# Patient Record
Sex: Male | Born: 1985 | Race: White | Hispanic: No | Marital: Single
Health system: Southern US, Community
[De-identification: ages and names within clinical notes are randomized; demographics above are authoritative.]

---

## 2009-08-04 ENCOUNTER — Emergency Department: Payer: Self-pay | Admitting: Emergency Medicine

## 2011-03-25 ENCOUNTER — Emergency Department: Payer: Self-pay | Admitting: Emergency Medicine

## 2011-05-18 IMAGING — US US PELVIS LIMITED
1 series · 17 of 25 positions shown · non-contrast
Comparison: none

REASON FOR EXAM: right testicular pain and swelling
COMMENTS:

[Series 1: us pelvis limited · 17 of 47 slices shown]
[im 1/47]
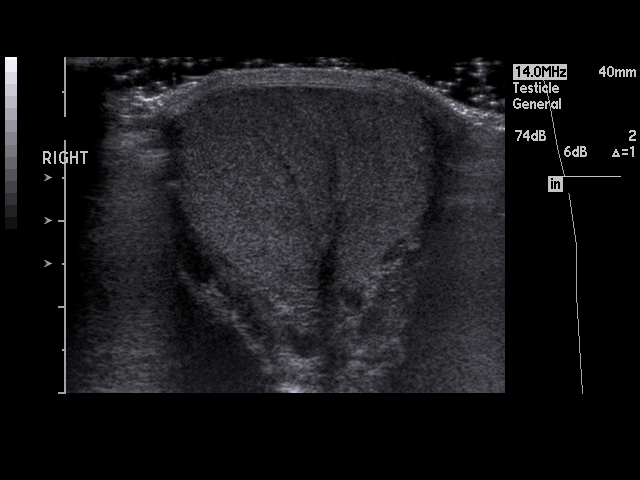
[im 4/47]
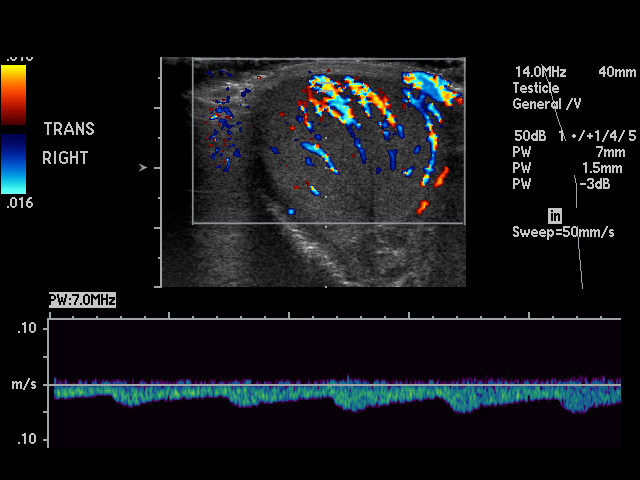
[im 6/47]
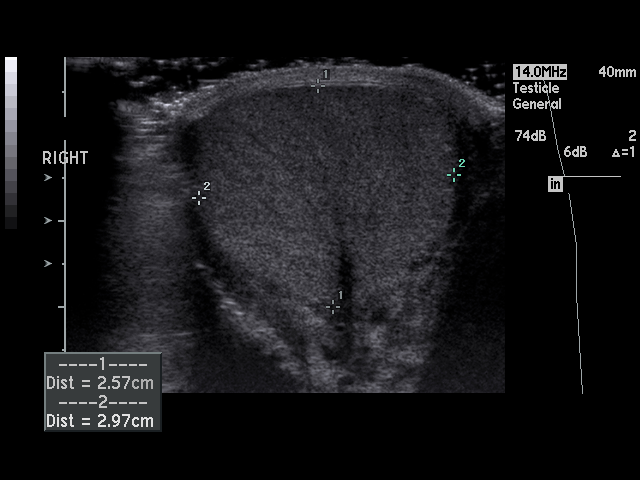
[im 10/47]
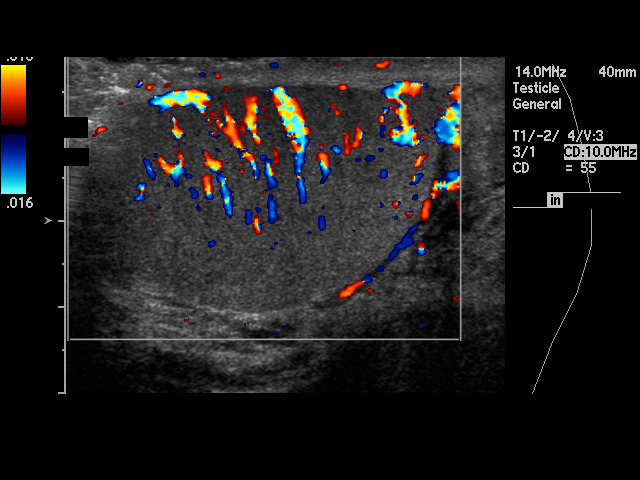
[im 12/47]
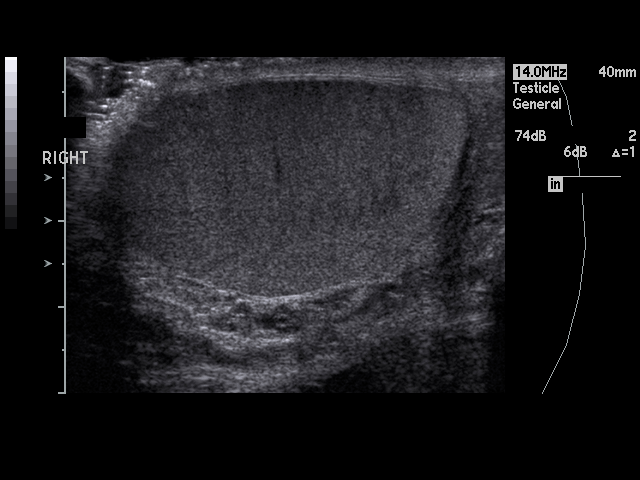
[im 16/47]
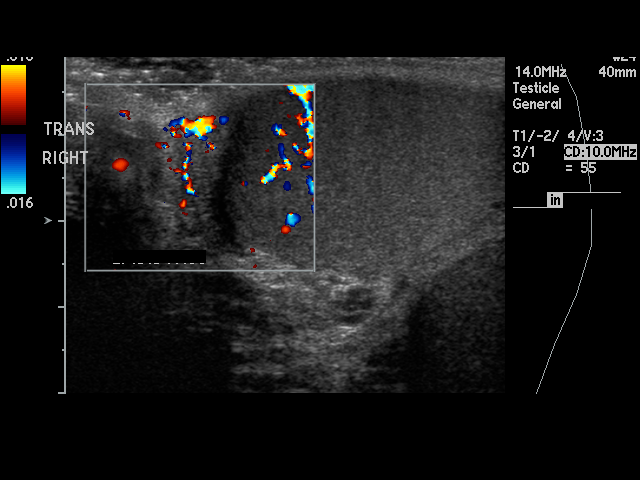
[im 18/47]
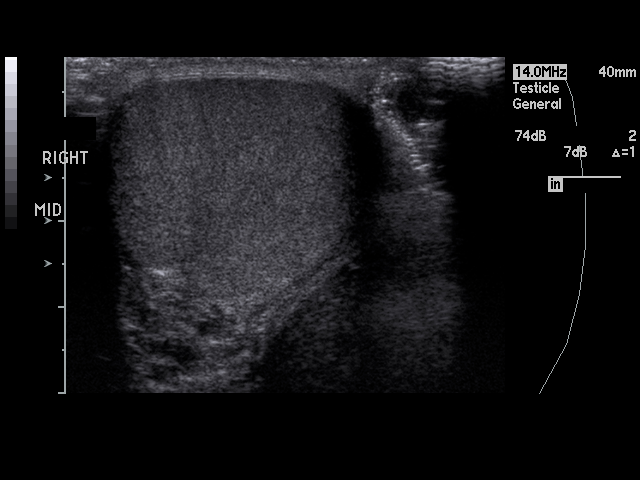
[im 22/47]
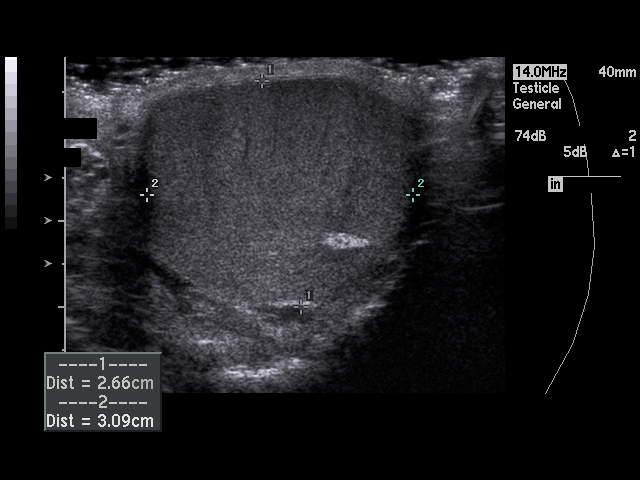
[im 24/47]
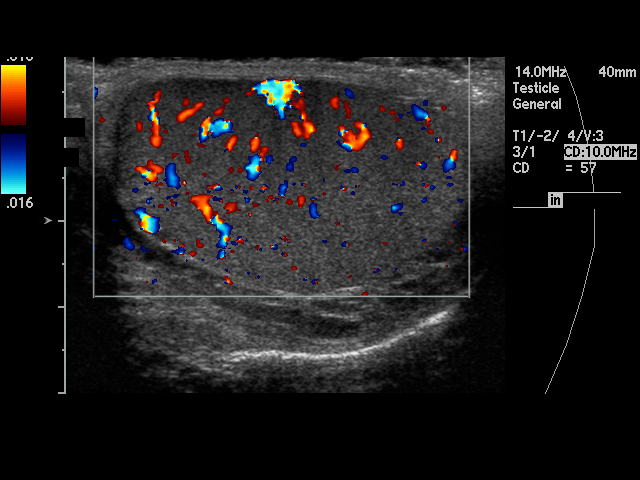
[im 25/47]
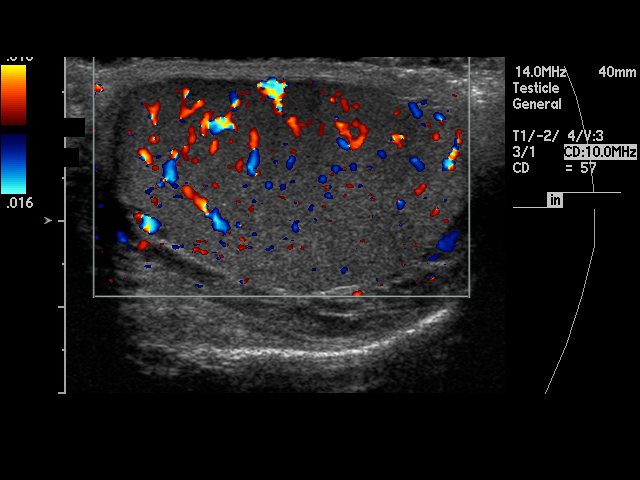
[im 29/47]
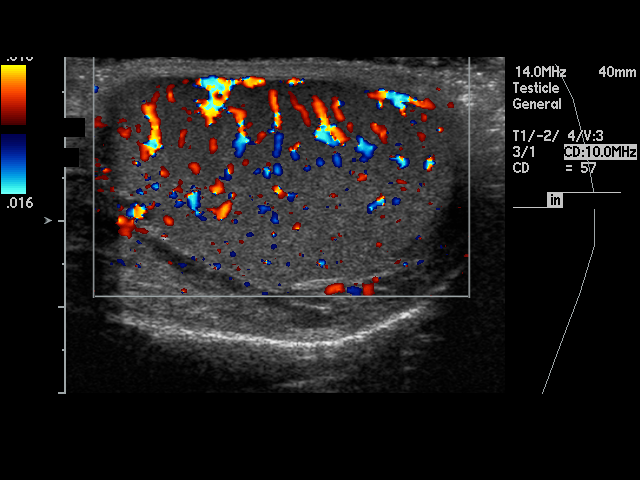
[im 31/47]
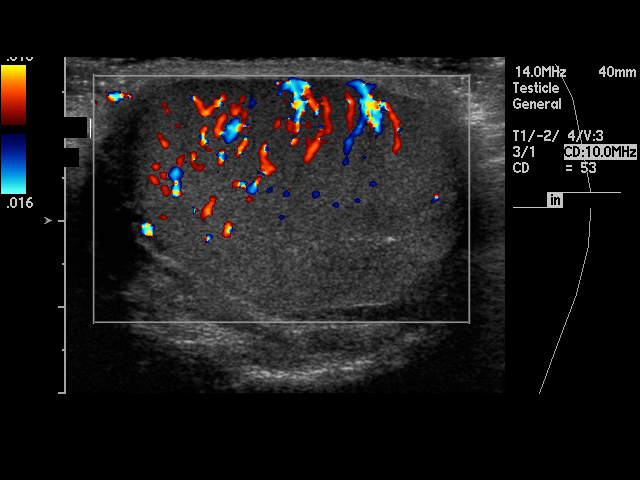
[im 35/47]
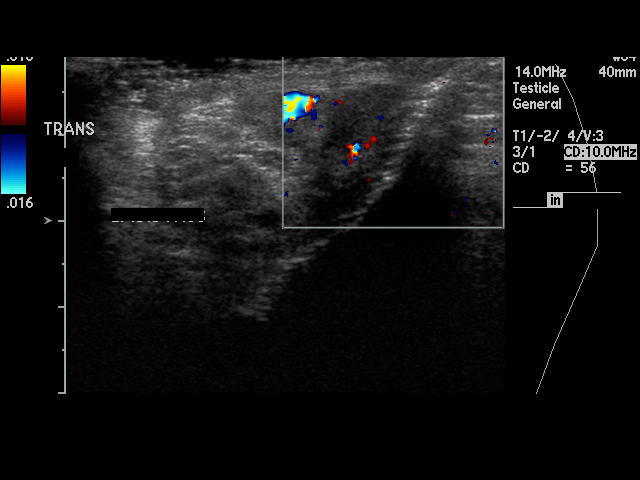
[im 37/47]
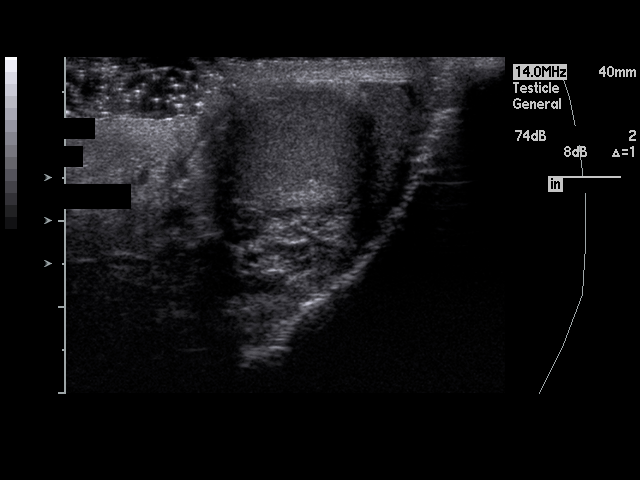
[im 41/47]
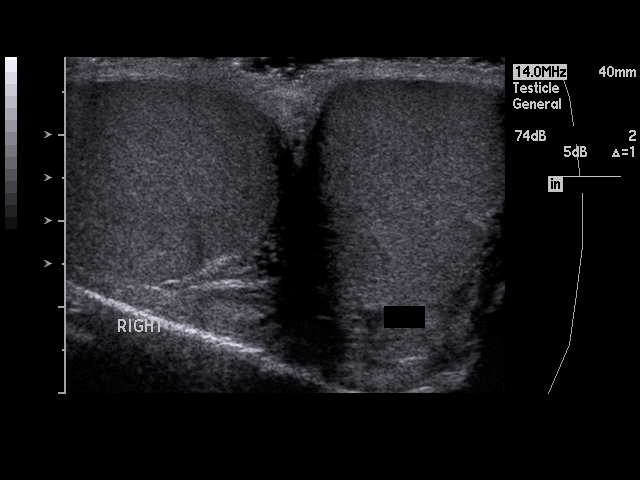
[im 43/47]
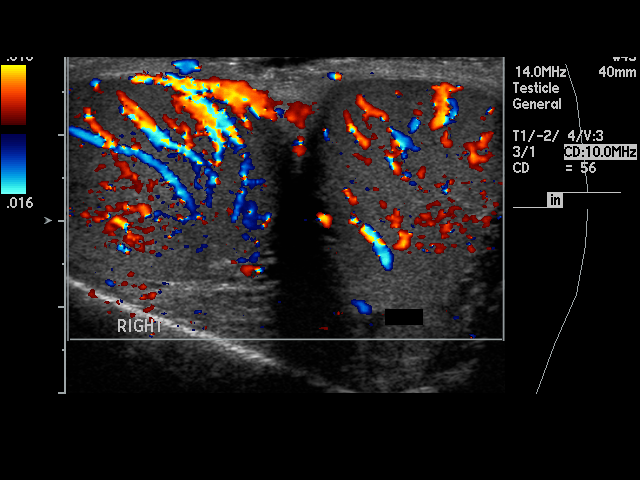
[im 47/47]
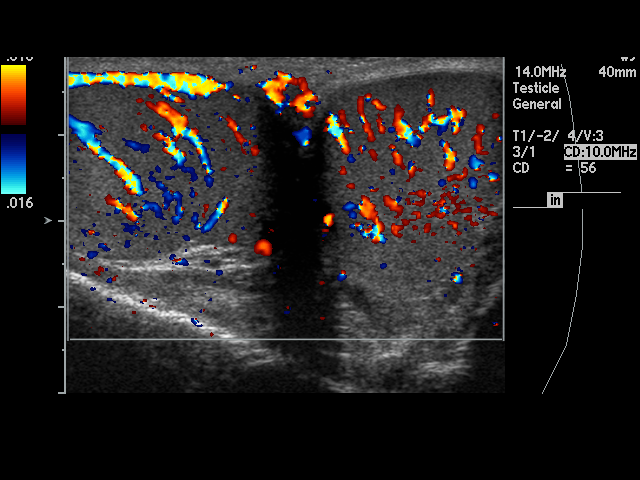

[17 of 25 positions shown; findings below may reference images not displayed]

PROCEDURE:     US  - US TESTICULAR  - August 04, 2009 [DATE]

RESULT:     The echotexture of the testes is normal bilaterally. The right
testicle measures 4 x 2.6 x 3 cm. Left testicle measures 4.2 x 2.7 x 3.1 cm.
Vascularity of both testes is normal. The epididymal structures are normal
in echotexture and size and exhibit normal vascularity. I see no hydrocele.
IMPRESSION: I do not see evidence of testicular torsion or
inflammation. No testicular or epididymal masses are demonstrated.

A preliminary report was sent to the [HOSPITAL] the conclusion
of the study.
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
## 2014-06-19 ENCOUNTER — Emergency Department: Payer: Self-pay | Admitting: Emergency Medicine
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
## 2021-09-09 ENCOUNTER — Emergency Department (HOSPITAL_COMMUNITY)
Admission: EM | Admit: 2021-09-09 | Discharge: 2021-09-09 | Disposition: A | Discharge status: Home / Self Care | Payer: Self-pay | Attending: Emergency Medicine | Admitting: Emergency Medicine

## 2021-09-09 ENCOUNTER — Emergency Department (HOSPITAL_COMMUNITY): Payer: Self-pay

## 2021-09-09 DIAGNOSIS — S61011A Laceration without foreign body of right thumb without damage to nail, initial encounter: Secondary | ICD-10-CM | POA: Diagnosis not present

## 2021-09-09 DIAGNOSIS — M7989 Other specified soft tissue disorders: Secondary | ICD-10-CM | POA: Diagnosis not present

## 2021-09-09 DIAGNOSIS — W228XXA Striking against or struck by other objects, initial encounter: Secondary | ICD-10-CM | POA: Diagnosis not present

## 2021-09-09 DIAGNOSIS — S50812A Abrasion of left forearm, initial encounter: Secondary | ICD-10-CM | POA: Insufficient documentation

## 2021-09-09 DIAGNOSIS — S61210A Laceration without foreign body of right index finger without damage to nail, initial encounter: Secondary | ICD-10-CM | POA: Diagnosis not present

## 2021-09-09 DIAGNOSIS — S61212A Laceration without foreign body of right middle finger without damage to nail, initial encounter: Secondary | ICD-10-CM | POA: Diagnosis not present

## 2021-09-09 DIAGNOSIS — S51811A Laceration without foreign body of right forearm, initial encounter: Secondary | ICD-10-CM | POA: Insufficient documentation

## 2021-09-09 DIAGNOSIS — S41111A Laceration without foreign body of right upper arm, initial encounter: Secondary | ICD-10-CM

## 2021-09-09 DIAGNOSIS — S59911A Unspecified injury of right forearm, initial encounter: Secondary | ICD-10-CM | POA: Diagnosis present

## 2021-09-09 LAB — I-STAT CHEM 8, ED
BUN: 16 mg/dL (ref 6–20)
Calcium, Ion: 1.15 mmol/L (ref 1.15–1.40)
Chloride: 102 mmol/L (ref 98–111)
Creatinine, Ser: 1.1 mg/dL (ref 0.61–1.24)
Glucose, Bld: 117 mg/dL — ABNORMAL HIGH (ref 70–99)
HCT: 38 % — ABNORMAL LOW (ref 39.0–52.0)
Hemoglobin: 12.9 g/dL — ABNORMAL LOW (ref 13.0–17.0)
Potassium: 3.7 mmol/L (ref 3.5–5.1)
Sodium: 139 mmol/L (ref 135–145)
TCO2: 27 mmol/L (ref 22–32)

## 2021-09-09 LAB — BASIC METABOLIC PANEL
Anion gap: 7 (ref 5–15)
BUN: 14 mg/dL (ref 6–20)
CO2: 28 mmol/L (ref 22–32)
Calcium: 9.3 mg/dL (ref 8.9–10.3)
Chloride: 105 mmol/L (ref 98–111)
Creatinine, Ser: 1.09 mg/dL (ref 0.61–1.24)
GFR, Estimated: >60 mL/min (ref >60)
Glucose, Bld: 124 mg/dL — ABNORMAL HIGH (ref 70–99)
Potassium: 3.8 mmol/L (ref 3.5–5.1)
Sodium: 140 mmol/L (ref 135–145)

## 2021-09-09 LAB — CBC
HCT: 39.1 % (ref 39.0–52.0)
Hemoglobin: 13.1 g/dL (ref 13.0–17.0)
MCH: 28.5 pg (ref 26.0–34.0)
MCHC: 33.5 g/dL (ref 30.0–36.0)
MCV: 85 fL (ref 80.0–100.0)
Platelets: 336 10*3/uL (ref 150–400)
RBC: 4.6 MIL/uL (ref 4.22–5.81)
RDW: 11.9 % (ref 11.5–15.5)
WBC: 15.1 10*3/uL — ABNORMAL HIGH (ref 4.0–10.5)
nRBC: 0 % (ref 0.0–0.2)

## 2021-09-09 MED ORDER — HYDROMORPHONE HCL 1 MG/ML IJ SOLN
INTRAMUSCULAR | Status: AC
  Administered 2021-09-09: 1 mg via INTRAVENOUS
  Filled 2021-09-09: qty 1

## 2021-09-09 MED ORDER — HYDROMORPHONE HCL 1 MG/ML IJ SOLN: 1.0000 mg | Freq: Once | INTRAMUSCULAR | Status: AC

## 2021-09-09 NOTE — Progress Notes (Signed)
   09/09/21 0205  Clinical Encounter Type  Visited With Patient  Visit Type Initial;Trauma  Referral From Nurse  Consult/Referral To Chaplain   Chaplain Jorene Guest responded to page. The patient is being treated by the medical team.  There is no support person present. Chaplain remains available as needed. This note was prepared by Jeanine Luz, M.Div..  For questions please contact by phone [phone].

## 2021-09-09 NOTE — ED Notes (Signed)
Kenneth Nolan, wife, [phone] can be called if need any info on pt

## 2021-09-09 NOTE — Progress Notes (Signed)
Orthopedic Tech Progress Note Patient Details:  Kenneth Nolan 1985/09/03 [id number]  Ortho Devices Type of Ortho Device: Volar splint Ortho Device/Splint Location: RUE Ortho Device/Splint Interventions: Ordered, Application, Adjustment   Post Interventions Patient Tolerated: Well Instructions Provided: Care of device High volar applied per request of MD to prevent knuckles from bending.  Vernona Rieger 09/09/2021, 3:44 AM

## 2021-09-09 NOTE — Discharge Instructions (Addendum)
Please keep the splint on for at least 3 days, preferably 5. Then return to get sutures out in about 10 days.
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
## 2021-09-09 NOTE — Progress Notes (Signed)
Orthopedic Tech Progress Note Patient Details:  Kenneth Nolan 1986/03/04 [id number]  Patient ID: Doristine Church, male   DOB: 23-Oct-1985, 36 y.o.   MRN: [id number] Level II; not needed at the moment.  Darleen Crocker 09/09/2021, 2:31 AM

## 2021-09-14 ENCOUNTER — Ambulatory Visit: Payer: Self-pay | Admitting: *Deleted

## 2021-09-14 NOTE — Telephone Encounter (Signed)
  Chief Complaint: requesting where he can go to get stitches removed Symptoms: stitches right wrist and knuckles. To be removed in 10 days  Frequency: na Pertinent Negatives: Patient denies na Disposition: [x] ED /[] Urgent Care (no appt availability in office) / [] Appointment(In office/virtual)/ []  [facility]/ [] Home Care/ [] Refused Recommended Disposition /[] [facility]/ []  Follow-up with PCP Additional Notes:   Patient does not have a PCP and reports he can not afford one. Instructed patient he will need to come back to ED and report he is there to have stitches removed.     Reason for Disposition  [1] Caller requesting NON-URGENT health information AND [2] PCP's office is the best resource  Answer Assessment - Initial Assessment Questions 1. REASON FOR CALL or QUESTION: "What is your reason for calling today?" or "How can I best help you?" or "What question do you have that I can help answer?"     Patient would like to know where he can go to get stitches out of wrist and knuckles.  Protocols used: Information Only Call - No Triage-A-AH
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
## 2023-10-29 ENCOUNTER — Other Ambulatory Visit: Payer: Self-pay

## 2023-10-29 ENCOUNTER — Encounter: Payer: Self-pay | Admitting: *Deleted

## 2023-10-29 ENCOUNTER — Emergency Department
Admission: EM | Admit: 2023-10-29 | Discharge: 2023-10-29 | Disposition: A | Discharge status: Home / Self Care | Payer: Self-pay

## 2023-10-29 DIAGNOSIS — K0889 Other specified disorders of teeth and supporting structures: Secondary | ICD-10-CM

## 2023-10-29 DIAGNOSIS — K047 Periapical abscess without sinus: Secondary | ICD-10-CM | POA: Insufficient documentation

## 2023-10-29 MED ORDER — AMOXICILLIN-POT CLAVULANATE 875-125 MG PO TABS
1.0000 | ORAL_TABLET | Freq: Once | ORAL | Status: AC
  Administered 2023-10-29: 1 via ORAL
  Filled 2023-10-29: qty 1

## 2023-10-29 MED ORDER — AMOXICILLIN-POT CLAVULANATE 875-125 MG PO TABS: 1.0000 | ORAL_TABLET | Freq: Two times a day (BID) | ORAL | 0 refills | Status: AC

## 2023-10-29 NOTE — ED Triage Notes (Signed)
 Pt ambulatory to triage.  Pt has pain in left upper gum.  Pt has a sore on upper gum.  Pt had a filling break off upper tooth recently. Pt alert.

## 2023-10-29 NOTE — Discharge Instructions (Addendum)
 You have been diagnosed with dental abscess, please take Augmentin  1 tablet by mouth every 12 hours after main meals for 7 days.  Please call to the dentist and make an appointment for a follow-up.  You can call [facility] to establish care.  Please use GoodRx card to buy your prescription.  Please come back to ED or go to your PCP if you have new symptoms or symptoms worsen. OPTIONS FOR DENTAL FOLLOW UP CARE  [place] [facility] - Local Safety Net Dental Clinics [email or web address]   [facility] [phone])  Norita Goldberg [phone])  [facility] [phone] ext [phone])  [facility] [phone])  [facility] [phone]) This clinic caters to the indigent population and is on a lottery system. Location: [employer] [facility], [employer], [address] [place] Clinic Hours: Wednesdays from 6pm - 9pm, patients seen by a lottery system. For dates, call or go to [email or web address] Services: Cleanings, fillings and simple extractions. Payment Options: DENTAL WORK IS FREE OF CHARGE. Bring proof of income or support. Best way to get seen: Arrive at 5:15 pm - this is a lottery, NOT first come/first serve, so arriving earlier will not increase your chances of being seen.     [facility] [phone] Select option 1 for emergencies   Location: [facility], [place], [address] [place] Clinic Hours: No walk-ins accepted - call the day before to schedule an appointment. Check in times are 9:30 am and 1:30 pm. Services: Simple extractions, temporary fillings, pulpectomy/pulp debridement, uncomplicated abscess drainage. Payment Options: PAYMENT IS DUE AT THE TIME OF SERVICE.  Fee is usually $100-200, additional surgical procedures (e.g. abscess drainage) may be  extra. Cash, checks, Visa/MasterCard accepted.  Can file Medicaid if patient is covered for dental - patient should call case worker to check. No discount for [facility] patients. Best way to get seen: MUST call the day before and get onto the schedule. Can usually be seen the next 1-2 days. No walk-ins accepted.     [facility] [phone]   Location: [facility], [address], [place] Clinic Hours: M, W, Th, F 8am or 1:30pm, Tues 9a or 1:30 - first come/first served. Services: Simple extractions, temporary fillings, uncomplicated abscess drainage.  You do not need to be an [facility] resident. Payment Options: PAYMENT IS DUE AT THE TIME OF SERVICE. Dental insurance, otherwise sliding scale - bring proof of income or support. Depending on income and treatment needed, cost is usually $50-200. Best way to get seen: Arrive early as it is first come/first served.     [facility] [phone]   Location: [address] Clinic Hours: Mon-Thu 8a-5p Services: Most basic dental services including extractions and fillings. Payment Options: PAYMENT IS DUE AT THE TIME OF SERVICE. Sliding scale, up to 50% off - bring proof if income or support. Medicaid with dental option accepted. Best way to get seen: Call to schedule an appointment, can usually be seen within 2 weeks OR they will try to see walk-ins - show up at 8a or 2p (you may have to wait).     [facility] [phone] [place] RESIDENTS ONLY   Location: [facility], [address], [place], [place] [address] Clinic Hours: By appointment only. Monday - Thursday 8am-5pm, Friday 8am-12pm Services: Cleanings, fillings, extractions. Payment Options: PAYMENT IS DUE  AT THE TIME OF SERVICE. Cash, Visa or MasterCard. Sliding scale - $30 minimum per service. Best way to get seen: Come in to office,  complete packet and make an appointment - need proof of income or support monies for each household member and proof of [facility] residence. Usually takes about a month to get in.     [facility] [phone]   Location: [address] [facility] Clinic Hours: Walk-in Urgent Care Dental Services are offered Monday-Friday mornings only. The numbers of emergencies accepted daily is limited to the number of providers available. Maximum 15 - Mondays, Wednesdays & Thursdays Maximum 10 - Tuesdays & Fridays Services: You do not need to be a [facility] resident to be seen for a dental emergency. Emergencies are defined as pain, swelling, abnormal bleeding, or dental trauma. Walkins will receive x-rays if needed. NOTE: Dental cleaning is not an emergency. Payment Options: PAYMENT IS DUE AT THE TIME OF SERVICE. Minimum co-pay is $40.00 for uninsured patients. Minimum co-pay is $3.00 for Medicaid with dental coverage. Dental Insurance is accepted and must be presented at time of visit. Medicare does not cover dental. Forms of payment: Cash, credit card, checks. Best way to get seen: If not previously registered with the clinic, walk-in dental registration begins at 7:15 am and is on a first come/first serve basis. If previously registered with the clinic, call to make an appointment.     [facility] [phone] [place] RESIDENTS ONLY   Location: [address] [place], [place] Clinic Hours: Mon-Thu 10a-2p Services: Extractions only! Payment Options: FREE (donations accepted) - bring proof of income or support Best way to get seen: Call and schedule an appointment OR come at 8am on the 1st Monday of every month (except for holidays) when it is first come/first served.     [facility] [phone]   Location: [address] [place], [place] Clinic Hours: Friday mornings Services, Payment Options, Best way to get seen: Call for  info

## 2023-10-29 NOTE — ED Provider Notes (Cosign Needed Addendum)
 [facility] Provider Note    Event Date/Time   First MD Initiated Contact with Patient 10/29/23 2233     (approximate)   History   Dental Pain    HPI  Kenneth Nolan is a 38 y.o. male    with a past medical history of laceration upper extremity,  who presents to the ED complaining of dental pain. According to the patient, symptoms began 4 weeks ago with presence of induration in the left upper gum.  Patient states a filling  came out 2 months ago.  Patient is not working, he does not have any insurance.  Patient denies having drainage from the area, patient denies pain, fever, chills, left ear pain.     There are no active problems to display for this patient.    ROS: Patient currently denies any vision changes, tinnitus, difficulty speaking, facial droop, sore throat, chest pain, shortness of breath, abdominal pain, nausea/vomiting/diarrhea, dysuria, or weakness/numbness/paresthesias in any extremity   Physical Exam   Triage Vital Signs: ED Triage Vitals  Encounter Vitals Group     BP 10/29/23 2213 114/79     Girls Systolic BP Percentile --      Girls Diastolic BP Percentile --      Boys Systolic BP Percentile --      Boys Diastolic BP Percentile --      Pulse Rate 10/29/23 2213 67     Resp 10/29/23 2213 18     Temp 10/29/23 2213 97.8 F (36.6 C)     Temp Source 10/29/23 2213 Oral     SpO2 10/29/23 2213 99 %     Weight 10/29/23 2211 188 lb (85.3 kg)     Height 10/29/23 2211 6' (1.829 m)     Head Circumference --      Peak Flow --      Pain Score 10/29/23 2211 1     Pain Loc --      Pain Education --      Exclude from Growth Chart --     Most recent vital signs: Vitals:   10/29/23 2213  BP: 114/79  Pulse: 67  Resp: 18  Temp: 97.8 F (36.6 C)  SpO2: 99%     Physical Exam Vitals and nursing note reviewed.  During triage vital signs were normal.  Constitutional:      General: Awake and alert. No acute distress.    Appearance:  Normal appearance. The patient is normal weight.      Able to speak in complete sentences without cough or dyspnea  HENT:     Head: Normocephalic and atraumatic.     Mouth: Mucous membranes are moist.  Presence of induration without fluctuation at the level of first premolar, #12.  No presence of pustular lesion, not tenderness to palpation, no secretion.  Eyes:     General: PERRL. Normal EOMs          Conjunctiva/sclera: Conjunctivae normal.  Nose No congestion/rhinorrhea  CV:                  Good peripheral perfusion.  Regular rate and rhythm  Resp:               Normal effort.  Equal breath sounds bilaterally.  Abd:                 No distention.  Soft, nontender.  No rebound or guarding.  Musculoskeletal:        General: No swelling.  Normal range of motion.  Skin:    General: Skin is warm and dry.     Capillary Refill: Capillary refill takes less than 2 seconds.     Findings: No rash.  Neurological:     Mental Status: The patient is awake and alert. MAE spontaneously. No gross focal neurologic deficits are appreciated. Psychiatric Mood and affect are normal. Speech and behavior are normal.  ED Results / Procedures / Treatments   Labs (all labs ordered are listed, but only abnormal results are displayed) Labs Reviewed - No data to display   EKG     RADIOLOGY     PROCEDURES:  Critical Care performed:   Procedures   MEDICATIONS ORDERED IN ED: Medications - No data to display    IMPRESSION / MDM / ASSESSMENT AND PLAN / ED COURSE  I reviewed the triage vital signs and the nursing notes.  Differential diagnosis includes, but is not limited to, dental abscess, foreign body, cavity.  Patient's presentation is most consistent with acute, uncomplicated illness.    Kenneth Nolan is a 38 y.o., male presents today with history of 4 weeks of induration in the left upper right gum, patient denies drainage, fever, chills, left ear pain.  Physical exam there is  presence of induration at the level of the tooth #12, first premolar, no evidence of secretions no tenderness to palpation.  Patient's diagnosis is consistent with dental abscess. I did not order any imaging or labs, physical exam is reassuring. I did review the patient's allergies and medications.The patient is in stable and satisfactory condition for discharge home.  Patient received first doses of Augmentin  due to pharmacy hours.  Patient will be discharged home with prescriptions for Augmentin . Patient is to follow up with dentist as needed or otherwise directed.  I did provide a list of dentist for him to call  and make an appointment .  I provided GoodRx card to buy prescription.  I provided information about [facility]. Patient is given ED precautions to return to the ED for any worsening or new symptoms. Discussed plan of care with patient, answered all of patient's questions, Patient agreeable to plan of care. Advised patient to take medications according to the instructions on the label. Discussed possible side effects of new medications. Patient verbalized understanding.    FINAL CLINICAL IMPRESSION(S) / ED DIAGNOSES   Final diagnoses:  Pain, dental  Dental abscess     Rx / DC Orders   ED Discharge Orders          Ordered    amoxicillin -clavulanate (AUGMENTIN ) 875-125 MG tablet  2 times daily        10/29/23 2303             Note:  This document was prepared using Dragon voice recognition software and may include unintentional dictation errors.   Janit Kast, PA-C 10/29/23 2305    Janit Kast, PA-C 10/29/23 2306    Clarine Ozell LABOR, MD 10/30/23 [phone]

## 2024-01-12 ENCOUNTER — Emergency Department
Admission: EM | Admit: 2024-01-12 | Discharge: 2024-01-12 | Disposition: A | Discharge status: Home / Self Care | Payer: Self-pay

## 2024-01-12 ENCOUNTER — Encounter: Payer: Self-pay | Admitting: Emergency Medicine

## 2024-01-12 ENCOUNTER — Other Ambulatory Visit: Payer: Self-pay

## 2024-01-12 DIAGNOSIS — K047 Periapical abscess without sinus: Secondary | ICD-10-CM | POA: Insufficient documentation

## 2024-01-12 MED ORDER — AMOXICILLIN-POT CLAVULANATE 875-125 MG PO TABS
1.0000 | ORAL_TABLET | Freq: Once | ORAL | Status: AC
  Administered 2024-01-12: 1 via ORAL
  Filled 2024-01-12: qty 1

## 2024-01-12 MED ORDER — AMOXICILLIN-POT CLAVULANATE 875-125 MG PO TABS: 1.0000 | ORAL_TABLET | Freq: Two times a day (BID) | ORAL | 0 refills | Status: AC

## 2024-01-12 NOTE — Discharge Instructions (Addendum)
 You have been diagnosed with dental abscess.  Please take Augmentin  1 tablet by mouth every 12 hours after main meal.  Please come back to ED if you have new symptoms or symptoms worsen.  Please make an appointment and see your dentist.

## 2024-01-12 NOTE — ED Provider Notes (Addendum)
 [facility] Provider Note    Event Date/Time   First MD Initiated Contact with Patient 01/12/24 2038     (approximate)   History   Dental Pain    HPI  Kenneth Nolan is a 38 y.o. male    with a past medical history of dental pain, laceration,  who presents to the ED complaining of dental pain. According to the patient, symptoms started a week ago with left upper abscess in the gum.  Patient denies fever, chills, dental pain.  Patient was seen here before by me and I prescribed antibiotics and referred to dentist.  Due to financial problems patient could not go to the dentist.     There are no active problems to display for this patient.    ROS: Patient currently denies any vision changes, tinnitus, difficulty speaking, facial droop, sore throat, chest pain, shortness of breath, abdominal pain, nausea/vomiting/diarrhea, dysuria, or weakness/numbness/paresthesias in any extremity   Physical Exam   Triage Vital Signs: ED Triage Vitals [01/12/24 2026]  Encounter Vitals Group     BP 126/86     Girls Systolic BP Percentile      Girls Diastolic BP Percentile      Boys Systolic BP Percentile      Boys Diastolic BP Percentile      Pulse Rate 83     Resp 18     Temp 97.7 F (36.5 C)     Temp Source Oral     SpO2 94 %     Weight      Height      Head Circumference      Peak Flow      Pain Score 2     Pain Loc      Pain Education      Exclude from Growth Chart     Most recent vital signs: Vitals:   01/12/24 2026  BP: 126/86  Pulse: 83  Resp: 18  Temp: 97.7 F (36.5 C)  SpO2: 94%     Physical Exam Vitals and nursing note reviewed.  During triage vital signs were normal, patient was afebrile  General:          Awake, no distress.  CV:                  Good peripheral perfusion.  Resp:               Normal effort. no tachypnea Abd:                 No distention.  Soft nontender Other:              Mouth: Presence of abscess in the left  upper gum, fluctuant.  Gum without edema, erythema.  No tenderness to palpation ED Results / Procedures / Treatments   Labs (all labs ordered are listed, but only abnormal results are displayed) Labs Reviewed - No data to display     PROCEDURES:  Critical Care performed:   .Incision and Drainage  Date/Time: 01/12/2024 9:06 PM  Performed by: Janit Kast, PA-C Authorized by: Janit Kast, PA-C   Consent:    Consent obtained:  Verbal   Consent given by:  Patient   Risks, benefits, and alternatives were discussed: yes     Risks discussed:  Incomplete drainage Universal protocol:    Procedure explained and questions answered to patient or proxy's satisfaction: yes     Patient identity confirmed:  Verbally with patient Location:  Type:  Abscess   Location:  Mouth   Mouth location:  Alveolar process Pre-procedure details:    Skin preparation:  Antiseptic wash Sedation:    Sedation type:  None Anesthesia:    Anesthesia method:  None Procedure type:    Complexity:  Simple Procedure details:    Ultrasound guidance: no     Needle aspiration: no     Incision types:  Stab incision   Drainage:  Purulent   Drainage amount:  Scant   Wound treatment:  Wound left open Post-procedure details:    Procedure completion:  Tolerated well, no immediate complications    MEDICATIONS ORDERED IN ED: Medications  amoxicillin -clavulanate (AUGMENTIN ) 875-125 MG per tablet 1 tablet (has no administration in time range)      IMPRESSION / MDM / ASSESSMENT AND PLAN / ED COURSE  I reviewed the triage vital signs and the nursing notes.  Differential diagnosis includes, but is not limited to, dental abscess, cellulitis, unlikely retropharyngeal abscess  Patient's presentation is most consistent with acute, uncomplicated illness.   Kenneth Nolan is a 38 y.o., male who presents today with history of a week of left upper gum abscess.  On a physical exam presence of white  discoloration in the left upper gum, fluctuation.  No erythema, no tenderness to palpation.  I did drain the abscess. Patient's diagnosis is consistent with dental abscess. I did not order any imaging or labs, physical exam was reassuring. I did review the patient's allergies and medications.The patient is in stable and satisfactory condition for discharge home  Patient will be discharged home with prescriptions for Augmentin . Patient is to follow up with dentist as needed or otherwise directed. Patient is given ED precautions to return to the ED for any worsening or new symptoms.  Patient does not have insurance, I did provide GoodRx card to buy prescriptions. Discussed plan of care with patient, answered all of patient's questions, Patient agreeable to plan of care. Advised patient to take medications according to the instructions on the label. Discussed possible side effects of new medications. Patient verbalized understanding.  FINAL CLINICAL IMPRESSION(S) / ED DIAGNOSES   Final diagnoses:  Dental abscess     Rx / DC Orders   ED Discharge Orders          Ordered    amoxicillin -clavulanate (AUGMENTIN ) 875-125 MG tablet  2 times daily        01/12/24 2109             Note:  This document was prepared using Dragon voice recognition software and may include unintentional dictation errors.   Janit Kast, PA-C 01/12/24 2110    Janit Kast, PA-C 01/12/24 2112    Nicholaus Rolland BRAVO, MD 01/13/24 [phone]

## 2024-01-12 NOTE — ED Triage Notes (Signed)
 Pt arrive POV, ambulatory to triage, gait steady, no acute distress noted c/o mouth abscess on left upper gum area x3-7 days. Pt concerned for infection.

## 2024-01-12 NOTE — ED Notes (Signed)
 Pt provided discharge instructions and prescription information. Pt was given the opportunity to ask questions and questions were answered.

## 2024-03-25 ENCOUNTER — Other Ambulatory Visit: Payer: Self-pay

## 2024-03-25 ENCOUNTER — Emergency Department
Admission: EM | Admit: 2024-03-25 | Discharge: 2024-03-25 | Disposition: A | Discharge status: Home / Self Care | Payer: Self-pay | Attending: Emergency Medicine | Admitting: Emergency Medicine

## 2024-03-25 DIAGNOSIS — S025XXA Fracture of tooth (traumatic), initial encounter for closed fracture: Secondary | ICD-10-CM | POA: Insufficient documentation

## 2024-03-25 DIAGNOSIS — K047 Periapical abscess without sinus: Secondary | ICD-10-CM | POA: Insufficient documentation

## 2024-03-25 DIAGNOSIS — W1839XA Other fall on same level, initial encounter: Secondary | ICD-10-CM | POA: Insufficient documentation

## 2024-03-25 MED ORDER — AMOXICILLIN-POT CLAVULANATE 875-125 MG PO TABS: 1.0000 | ORAL_TABLET | Freq: Two times a day (BID) | ORAL | 0 refills | Status: AC

## 2024-03-25 MED ORDER — AMOXICILLIN-POT CLAVULANATE 875-125 MG PO TABS
1.0000 | ORAL_TABLET | Freq: Once | ORAL | Status: AC
  Administered 2024-03-25: 1 via ORAL
  Filled 2024-03-25: qty 1

## 2024-03-25 NOTE — ED Triage Notes (Signed)
 Pt c/o dental pain to left upper incisor with recurrent abscess. Pt says he pops it and does not have financial resources to have it fix it. Pt denies pain at this time. Small amount of swelling to L cheek.

## 2024-03-25 NOTE — ED Provider Notes (Signed)
 "   [facility] Emergency Department Provider Note     Event Date/Time   First MD Initiated Contact with Patient 03/25/24 1618     (approximate)   History   Dental Pain   HPI  Kenneth Nolan is a 38 y.o. male presents to the ED with complaint of possible dental infection to tooth #12. States tooth filling broke off a year 1/2 ago. Has a procedure scheduled in January 2026 to get a root canal. Noted this AM wife believed his cheek was swelling. Patient states he will notice a white bump in which he pops himself at home. Denies fever, chills, pain, or difficulty swallowing.      Physical Exam   Triage Vital Signs: ED Triage Vitals  Encounter Vitals Group     BP 03/25/24 1442 117/78     Girls Systolic BP Percentile --      Girls Diastolic BP Percentile --      Boys Systolic BP Percentile --      Boys Diastolic BP Percentile --      Pulse Rate 03/25/24 1442 83     Resp 03/25/24 1442 15     Temp 03/25/24 1442 98.1 F (36.7 C)     Temp Source 03/25/24 1442 Oral     SpO2 03/25/24 1442 97 %     Weight 03/25/24 1444 200 lb (90.7 kg)     Height 03/25/24 1444 6' 1 (1.854 m)     Head Circumference --      Peak Flow --      Pain Score 03/25/24 1443 0     Pain Loc --      Pain Education --      Exclude from Growth Chart --     Most recent vital signs: Vitals:   03/25/24 1442  BP: 117/78  Pulse: 83  Resp: 15  Temp: 98.1 F (36.7 C)  SpO2: 97%    General Awake, no distress.  HEENT NCAT.  CV:  Good peripheral perfusion.  RESP:  Normal effort.  ABD:  No distention.  Other:  Broken tooth to tooth #12 of the upper left maxillary bone.  Nontender to palpation. No visualized or palpable abscess to indicate incision and drainage.  No notable swelling to left cheek.  Nontender to palpation.   ED Results / Procedures / Treatments   Labs (all labs ordered are listed, but only abnormal results are displayed) Labs Reviewed - No data to display  No  results found.  PROCEDURES:  Critical Care performed: No  Procedures   MEDICATIONS ORDERED IN ED: Medications  amoxicillin -clavulanate (AUGMENTIN ) 875-125 MG per tablet 1 tablet (1 tablet Oral Given 03/25/24 1636)     IMPRESSION / MDM / ASSESSMENT AND PLAN / ED COURSE  I reviewed the triage vital signs and the nursing notes.                               38 y.o. male presents to the emergency department for evaluation and treatment of dental infection. See HPI for further details.   Differential diagnosis includes, but is not limited to pulpitis, dental infection, dental abscess, dental caries  Patient's presentation is most consistent with acute, uncomplicated illness.  Patient is alert and oriented.  He is hemodynamic stable and afebrile.  Physical exam findings are stated above.  No indication for I&D.  Patient has a scheduled appointment for January 4 for a root  canal of this tooth.  Will prescribe Augmentin  to cover infection.  Will give first dose in ED.  Patient is agreement with this care plan.  ED return precaution discussed. All questions and concerns were addressed during this ED visit.    FINAL CLINICAL IMPRESSION(S) / ED DIAGNOSES   Final diagnoses:  Dental infection  Closed fracture of tooth, initial encounter   Rx / DC Orders   ED Discharge Orders          Ordered    amoxicillin -clavulanate (AUGMENTIN ) 875-125 MG tablet  2 times daily        03/25/24 1637            Note:  This document was prepared using Dragon voice recognition software and may include unintentional dictation errors.    Margrette, Kalee Mcclenathan A, PA-C 03/25/24 ARDELLA Floy Roberts, MD 03/25/24 1732  "

## 2024-03-25 NOTE — Discharge Instructions (Signed)
 Continue to go to your scheduled dental appointment for root canal procedure in January. If any new or worsening symptoms occur as discussed such as fever, abscess formation or difficulty swallowing etc.occurs please return to ED for further evaluation.
